# Patient Record
Sex: Female | Born: 1975
Health system: Southern US, Community
[De-identification: ages and names within clinical notes are randomized; demographics above are authoritative.]

## PROBLEM LIST (undated history)

## (undated) DIAGNOSIS — N840 Polyp of corpus uteri: Secondary | ICD-10-CM

## (undated) HISTORY — DX: Polyp of corpus uteri: N84.0

---

## 2017-12-04 ENCOUNTER — Other Ambulatory Visit: Payer: Self-pay

## 2017-12-04 ENCOUNTER — Encounter: Payer: Self-pay | Admitting: Emergency Medicine

## 2017-12-04 ENCOUNTER — Emergency Department
Admission: EM | Admit: 2017-12-04 | Discharge: 2017-12-04 | Disposition: A | Discharge status: Home / Self Care | Payer: No Typology Code available for payment source | Source: Home / Self Care | Attending: Family Medicine | Admitting: Family Medicine

## 2017-12-04 DIAGNOSIS — R21 Rash and other nonspecific skin eruption: Secondary | ICD-10-CM

## 2017-12-04 MED ORDER — PREDNISONE 50 MG PO TABS: 50.0000 mg | ORAL_TABLET | Freq: Every day | ORAL | 0 refills | Status: AC

## 2017-12-04 MED ORDER — CETIRIZINE HCL 10 MG PO TABS: 10.0000 mg | ORAL_TABLET | Freq: Every day | ORAL | 0 refills | Status: AC

## 2017-12-04 MED ORDER — METHYLPREDNISOLONE SODIUM SUCC 40 MG IJ SOLR
80.0000 mg | Freq: Once | INTRAMUSCULAR | Status: AC
  Administered 2017-12-04: 80 mg via INTRAMUSCULAR

## 2017-12-04 NOTE — ED Triage Notes (Signed)
Here with raised red bumps that started last Thursday. Progressed to left lower arm, left knee and back area. Weeping noted to left lower arm. Itching, denies pain. Tried steroid cream today.

## 2017-12-04 NOTE — ED Provider Notes (Signed)
[name]    CSN: [id number] Arrival date & time: 12/04/17  1230     History   Chief Complaint Chief Complaint  Patient presents with  . Rash    HPI Suzanne Henry is a 42 y.o. female.   HPI Suzanne Henry is a 42 y.o. female presenting to UC with c/o 3 days of worsening red itching bumps that continue to spread. Initial bumps started on Left wrist, and have since spread to her Right wrist, Right forearm, Left upper arm, Right knee and one spot on her Left lower back. Some of the bumps are weeping. She applied triamcinolone cream with morning with mild relief.  Denies fever, chills, n/v/d. No oral swelling or SOB. No known sick contacts. No new soaps, lotions or medications.      History reviewed. No pertinent past medical history.  There are no active problems to display for this patient.   History reviewed. No pertinent surgical history.  OB History   None      Home Medications    Prior to Admission medications   Medication Sig Start Date End Date Taking? Authorizing Provider  cetirizine (ZYRTEC) 10 MG tablet Take 1 tablet (10 mg total) by mouth daily. For 1-2 weeks, then daily as needed 12/04/17   Noe Gens, PA-C  predniSONE (DELTASONE) 50 MG tablet Take 1 tablet (50 mg total) by mouth daily with breakfast for 5 days. 12/04/17 12/09/17  Noe Gens, PA-C    Family History History reviewed. No pertinent family history.  Social History Social History   Tobacco Use  . Smoking status: Never Smoker  . Smokeless tobacco: Never Used  Substance Use Topics  . Alcohol use: Not on file  . Drug use: Not on file     Allergies   Patient has no allergy information on record.   Review of Systems Review of Systems  Respiratory: Negative for shortness of breath, wheezing and stridor.   Musculoskeletal: Negative for arthralgias, joint swelling and myalgias.  Skin: Positive for rash.     Physical Exam Triage Vital Signs ED Triage Vitals    Enc Vitals Group     BP 12/04/17 1253 112/67     Pulse Rate 12/04/17 1253 75     Resp --      Temp 12/04/17 1253 98.1 F (36.7 C)     Temp Source 12/04/17 1253 Oral     SpO2 12/04/17 1253 100 %     Weight 12/04/17 1254 115 lb 6.4 oz (52.3 kg)     Height --      Head Circumference --      Peak Flow --      Pain Score 12/04/17 1254 0     Pain Loc --      Pain Edu? --      Excl. in [place]? --    No data found.  Updated Vital Signs BP 112/67 (BP Location: Right Arm)   Pulse 75   Temp 98.1 F (36.7 C) (Oral)   Wt 115 lb 6.4 oz (52.3 kg)   SpO2 100%   Visual Acuity Right Eye Distance:   Left Eye Distance:   Bilateral Distance:    Right Eye Near:   Left Eye Near:    Bilateral Near:     Physical Exam  Constitutional: She is oriented to person, place, and time. She appears well-developed and well-nourished. No distress.  HENT:  Head: Normocephalic and atraumatic.  Eyes: EOM are normal.  Neck: Normal  range of motion.  Cardiovascular: Normal rate.  Pulmonary/Chest: Effort normal.  Musculoskeletal: Normal range of motion.  Neurological: She is alert and oriented to person, place, and time.  Skin: Skin is warm and dry. Rash noted. She is not diaphoretic. There is erythema.  Diffuse erythematous papular rash on bilateral wrists, arms, Right knee and left lower back. Non-tender. Some papules are draining scant clear discharge with dried yellow crusting discharge.  No marks between web spaces on fingers. No tract marks.   Psychiatric: She has a normal mood and affect. Her behavior is normal.  Nursing note and vitals reviewed.    UC Treatments / Results  Labs (all labs ordered are listed, but only abnormal results are displayed) Labs Reviewed - No data to display  EKG None  Radiology No results found.  Procedures Procedures (including critical care time)  Medications Ordered in UC Medications  methylPREDNISolone sodium succinate (SOLU-MEDROL) 40 mg/mL injection 80  mg (80 mg Intramuscular Given 12/04/17 1301)    Initial Impression / Assessment and Plan / UC Course  I have reviewed the triage vital signs and the nursing notes.  Pertinent labs & imaging results that were available during my care of the patient were reviewed by me and considered in my medical decision making (see chart for details).     Nonspecific pruritic rash. No evidence of underlying infection at this time. Will tx symptomatically.  Final Clinical Impressions(s) / UC Diagnoses   Final diagnoses:  Rash and nonspecific skin eruption     Discharge Instructions      Keep rash clean with luke warm water and mild soap. Avoid hot water as this may cause worsening of itching and drying of the skin.  You may continue to use the triamcinolone cream.  You were given a shot of solumedrol (a steroid) today to help with inflammation and itching of the rash.  You have been prescribed 5 days of prednisone, an oral steroid.  You may start this medication tomorrow with breakfast.    Please follow up with family medicine in 1 week if not improving, sooner if significantly worsening.     ED Prescriptions    Medication Sig Dispense Auth. Provider   predniSONE (DELTASONE) 50 MG tablet Take 1 tablet (50 mg total) by mouth daily with breakfast for 5 days. 5 tablet Leeroy Cha O, PA-C   cetirizine (ZYRTEC) 10 MG tablet Take 1 tablet (10 mg total) by mouth daily. For 1-2 weeks, then daily as needed 30 tablet Noe Gens, [place]     Controlled Substance Prescriptions [place] Controlled Substance Registry consulted? Not Applicable   Tyrell Antonio 12/04/17 1323

## 2017-12-04 NOTE — Discharge Instructions (Signed)
°  Keep rash clean with luke warm water and mild soap. Avoid hot water as this may cause worsening of itching and drying of the skin.  You may continue to use the triamcinolone cream.  You were given a shot of solumedrol (a steroid) today to help with inflammation and itching of the rash.  You have been prescribed 5 days of prednisone, an oral steroid.  You may start this medication tomorrow with breakfast.    Please follow up with family medicine in 1 week if not improving, sooner if significantly worsening.

## 2018-01-24 ENCOUNTER — Encounter: Payer: Self-pay | Admitting: Emergency Medicine

## 2018-01-24 ENCOUNTER — Emergency Department
Admission: EM | Admit: 2018-01-24 | Discharge: 2018-01-24 | Disposition: A | Discharge status: Home / Self Care | Payer: No Typology Code available for payment source | Source: Home / Self Care | Attending: Family Medicine | Admitting: Family Medicine

## 2018-01-24 ENCOUNTER — Other Ambulatory Visit: Payer: Self-pay

## 2018-01-24 DIAGNOSIS — W57XXXA Bitten or stung by nonvenomous insect and other nonvenomous arthropods, initial encounter: Secondary | ICD-10-CM

## 2018-01-24 DIAGNOSIS — S1096XA Insect bite of unspecified part of neck, initial encounter: Secondary | ICD-10-CM | POA: Diagnosis not present

## 2018-01-24 MED ORDER — PREDNISONE 50 MG PO TABS: 50.0000 mg | ORAL_TABLET | Freq: Every day | ORAL | 0 refills | Status: AC

## 2018-01-24 NOTE — ED Provider Notes (Signed)
[name]    CSN: [id number] Arrival date & time: 01/24/18  1647     History   Chief Complaint Chief Complaint  Patient presents with  . Rash    HPI Suzanne Henry is a 42 y.o. female.   HPI Suzanne Henry is a 42 y.o. female presenting to UC with c/o gradually worsening redness, itching and mild swelling to the Right side of her neck that started about 2 hours ago after being bit or stung by an insect.  She has not tried anything for her symptoms. Denies throat swelling, trouble breathing or SOB. Denies other rashes today. She did have a rash a few weeks ago and did well with prednisone. She was wondering if she needs more prednisone for this bite/sting.    History reviewed. No pertinent past medical history.  There are no active problems to display for this patient.   History reviewed. No pertinent surgical history.  OB History   None      Home Medications    Prior to Admission medications   Medication Sig Start Date End Date Taking? Authorizing Provider  cetirizine (ZYRTEC) 10 MG tablet Take 1 tablet (10 mg total) by mouth daily. For 1-2 weeks, then daily as needed 12/04/17   Noe Gens, PA-C  predniSONE (DELTASONE) 50 MG tablet Take 1 tablet (50 mg total) by mouth daily with breakfast for 5 days. 01/24/18 01/29/18  Noe Gens, PA-C    Family History History reviewed. No pertinent family history.  Social History Social History   Tobacco Use  . Smoking status: Never Smoker  . Smokeless tobacco: Never Used  Substance Use Topics  . Alcohol use: Not on file  . Drug use: Not on file     Allergies   Patient has no allergy information on record.   Review of Systems Review of Systems  HENT: Negative for trouble swallowing and voice change.   Respiratory: Negative for chest tightness, shortness of breath and wheezing.   Gastrointestinal: Negative for nausea and vomiting.  Skin: Positive for rash. Negative for wound.     Physical  Exam Triage Vital Signs ED Triage Vitals  Enc Vitals Group     BP 01/24/18 1711 113/69     Pulse Rate 01/24/18 1711 71     Resp --      Temp 01/24/18 1711 98.3 F (36.8 C)     Temp Source 01/24/18 1711 Oral     SpO2 01/24/18 1711 100 %     Weight 01/24/18 1712 118 lb (53.5 kg)     Height 01/24/18 1712 5\' 2"  (1.575 m)     Head Circumference --      Peak Flow --      Pain Score 01/24/18 1712 8     Pain Loc --      Pain Edu? --      Excl. in [place]? --    No data found.  Updated Vital Signs BP 113/69 (BP Location: Right Arm)   Pulse 71   Temp 98.3 F (36.8 C) (Oral)   Ht 5\' 2"  (1.575 m)   Wt 118 lb (53.5 kg)   SpO2 100%   BMI 21.58 kg/m   Visual Acuity Right Eye Distance:   Left Eye Distance:   Bilateral Distance:    Right Eye Near:   Left Eye Near:    Bilateral Near:     Physical Exam  Constitutional: She is oriented to person, place, and time. She appears well-developed  and well-nourished.  HENT:  Head: Normocephalic and atraumatic.    Mouth/Throat: Oropharynx is clear and moist.  3cm area of erythema with centralized 20mm papule. Minimally tender. No bleeding or drainage. No induration or fluctuance.   Eyes: EOM are normal.  Neck: Normal range of motion.  Cardiovascular: Normal rate.  Pulmonary/Chest: Effort normal. No stridor. No respiratory distress. She has no wheezes.  Musculoskeletal: Normal range of motion.  Neurological: She is alert and oriented to person, place, and time.  Skin: Skin is warm and dry. There is erythema.  Psychiatric: She has a normal mood and affect. Her behavior is normal.  Nursing note and vitals reviewed.    UC Treatments / Results  Labs (all labs ordered are listed, but only abnormal results are displayed) Labs Reviewed - No data to display  EKG None  Radiology No results found.  Procedures Procedures (including critical care time)  Medications Ordered in UC Medications - No data to display  Initial Impression /  Assessment and Plan / UC Course  I have reviewed the triage vital signs and the nursing notes.  Pertinent labs & imaging results that were available during my care of the patient were reviewed by me and considered in my medical decision making (see chart for details).    Hx and exam c/w insect bite/sting Depo-medrol given in UC New prescription for prednisone and home care instructions provided.  Final Clinical Impressions(s) / UC Diagnoses   Final diagnoses:  Insect bite of neck, initial encounter     Discharge Instructions      You were given a shot of depo-medrol (a steroid) today to help with itching and swelling from a likely allergic reaction.  You have been prescribed 5 days of prednisone, an oral steroid.  You may start this medication tomorrow with breakfast.   You may also use a cool compress and the triamcinolone cream and cetirizine (Zyrtec) to help with inflammation and itching.      ED Prescriptions    Medication Sig Dispense Auth. Provider   predniSONE (DELTASONE) 50 MG tablet Take 1 tablet (50 mg total) by mouth daily with breakfast for 5 days. 5 tablet Noe Gens, PA-C     Controlled Substance Prescriptions [place] Controlled Substance Registry consulted? Not Applicable   Tyrell Antonio 01/24/18 1758

## 2018-01-24 NOTE — Discharge Instructions (Addendum)
°  You were given a shot of depo-medrol (a steroid) today to help with itching and swelling from a likely allergic reaction.  You have been prescribed 5 days of prednisone, an oral steroid.  You may start this medication tomorrow with breakfast.   You may also use a cool compress and the triamcinolone cream and cetirizine (Zyrtec) to help with inflammation and itching.

## 2018-01-24 NOTE — ED Triage Notes (Signed)
Right side of neck, possible bug bite, red, swollen itchy, burning, spreading

## 2018-12-02 ENCOUNTER — Other Ambulatory Visit: Payer: Self-pay | Admitting: Family Medicine

## 2018-12-02 DIAGNOSIS — Z1231 Encounter for screening mammogram for malignant neoplasm of breast: Secondary | ICD-10-CM

## 2019-01-18 ENCOUNTER — Other Ambulatory Visit: Payer: Self-pay

## 2019-01-18 ENCOUNTER — Ambulatory Visit
Admission: RE | Admit: 2019-01-18 | Discharge: 2019-01-18 | Disposition: A | Discharge status: Home / Self Care | Payer: No Typology Code available for payment source | Source: Ambulatory Visit | Attending: Family Medicine | Admitting: Family Medicine

## 2019-01-18 DIAGNOSIS — Z1231 Encounter for screening mammogram for malignant neoplasm of breast: Secondary | ICD-10-CM

## 2020-03-11 ENCOUNTER — Telehealth: Payer: Self-pay | Admitting: *Deleted

## 2020-03-11 NOTE — Telephone Encounter (Signed)
Wants to schedule New GYN appointment, advised of medical records transfer to get appointment scheduled. Agreed to contact office in [place].

## 2020-03-28 ENCOUNTER — Other Ambulatory Visit (HOSPITAL_COMMUNITY)
Admission: RE | Admit: 2020-03-28 | Discharge: 2020-03-28 | Disposition: A | Discharge status: Home / Self Care | Payer: No Typology Code available for payment source | Source: Ambulatory Visit | Attending: Obstetrics & Gynecology | Admitting: Obstetrics & Gynecology

## 2020-03-28 ENCOUNTER — Encounter: Payer: Self-pay | Admitting: Obstetrics & Gynecology

## 2020-03-28 ENCOUNTER — Other Ambulatory Visit: Payer: Self-pay

## 2020-03-28 ENCOUNTER — Encounter: Payer: Self-pay | Admitting: *Deleted

## 2020-03-28 ENCOUNTER — Ambulatory Visit (INDEPENDENT_AMBULATORY_CARE_PROVIDER_SITE_OTHER): Payer: No Typology Code available for payment source | Admitting: Obstetrics & Gynecology

## 2020-03-28 VITALS — BP 89/54 | HR 67 | Resp 16 | Ht 62.0 in | Wt 122.0 lb

## 2020-03-28 DIAGNOSIS — N852 Hypertrophy of uterus: Secondary | ICD-10-CM

## 2020-03-28 DIAGNOSIS — Z01419 Encounter for gynecological examination (general) (routine) without abnormal findings: Secondary | ICD-10-CM | POA: Diagnosis not present

## 2020-03-28 DIAGNOSIS — D500 Iron deficiency anemia secondary to blood loss (chronic): Secondary | ICD-10-CM

## 2020-03-28 DIAGNOSIS — Z1231 Encounter for screening mammogram for malignant neoplasm of breast: Secondary | ICD-10-CM

## 2020-03-28 DIAGNOSIS — N920 Excessive and frequent menstruation with regular cycle: Secondary | ICD-10-CM | POA: Diagnosis not present

## 2020-03-28 DIAGNOSIS — Z124 Encounter for screening for malignant neoplasm of cervix: Secondary | ICD-10-CM | POA: Diagnosis present

## 2020-03-28 NOTE — Patient Instructions (Addendum)
Please sent me a copy of your iron levels if possible.    Levonorgestrel intrauterine device (IUD) What is this medicine? LEVONORGESTREL IUD (LEE voe nor jes trel) is a contraceptive (birth control) device. The device is placed inside the uterus by a health care provider. It is used to prevent pregnancy. Some devices can also be used to treat heavy bleeding that occurs during your period. This medicine may be used for other purposes; ask your health care provider or pharmacist if you have questions. COMMON BRAND NAME(S): Minette [place] What should I tell my health care provider before I take this medicine? They need to know if you have any of these conditions:  abnormal Pap smear  cancer of the breast, uterus, or cervix  diabetes  endometritis  genital or pelvic infection now or in the past  have more than one sexual partner or your partner has more than one partner  heart disease  history of an ectopic or tubal pregnancy  immune system problems  IUD in place  liver disease or tumor  problems with blood clots or take blood-thinners  seizures  use intravenous drugs  uterus of unusual shape  vaginal bleeding that has not been explained  an unusual or allergic reaction to levonorgestrel, other hormones, silicone, or polyethylene, medicines, foods, dyes, or preservatives  pregnant or trying to get pregnant  breast-feeding How should I use this medicine? This device is placed inside the uterus by a health care professional. Talk to your pediatrician regarding the use of this medicine in children. Special care may be needed. Overdosage: If you think you have taken too much of this medicine contact a poison control center or emergency room at once. NOTE: This medicine is only for you. Do not share this medicine with others. What if I miss a dose? This does not apply. Depending on the brand of device you have inserted, the device will need to be replaced  every 3 to 7 years if you wish to continue using this type of birth control. What may interact with this medicine? Do not take this medicine with any of the following medications:  amprenavir  bosentan  fosamprenavir This medicine may also interact with the following medications:  aprepitant  armodafinil  barbiturate medicines for inducing sleep or treating seizures  bexarotene  boceprevir  griseofulvin  medicines to treat seizures like carbamazepine, ethotoin, felbamate, oxcarbazepine, phenytoin, topiramate  modafinil  pioglitazone  rifabutin  rifampin  rifapentine  some medicines to treat HIV infection like atazanavir, efavirenz, indinavir, lopinavir, nelfinavir, tipranavir, ritonavir  St. John's wort  warfarin This list may not describe all possible interactions. Give your health care provider a list of all the medicines, herbs, non-prescription drugs, or dietary supplements you use. Also tell them if you smoke, drink alcohol, or use illegal drugs. Some items may interact with your medicine. What should I watch for while using this medicine? Visit your doctor or health care professional for regular check ups. See your doctor if you or your partner has sexual contact with others, becomes HIV positive, or gets a sexual transmitted disease. This product does not protect you against HIV infection (AIDS) or other sexually transmitted diseases. You can check the placement of the IUD yourself by reaching up to the top of your vagina with clean fingers to feel the threads. Do not pull on the threads. It is a good habit to check placement after each menstrual period. Call your doctor right away if you feel more  of the IUD than just the threads or if you cannot feel the threads at all. The IUD may come out by itself. You may become pregnant if the device comes out. If you notice that the IUD has come out use a backup birth control method like condoms and call your health care  provider. Using tampons will not change the position of the IUD and are okay to use during your period. This IUD can be safely scanned with magnetic resonance imaging (MRI) only under specific conditions. Before you have an MRI, tell your healthcare provider that you have an IUD in place, and which type of IUD you have in place. What side effects may I notice from receiving this medicine? Side effects that you should report to your doctor or health care professional as soon as possible:  allergic reactions like skin rash, itching or hives, swelling of the face, lips, or tongue  fever, flu-like symptoms  genital sores  high blood pressure  no menstrual period for 6 weeks during use  pain, swelling, warmth in the leg  pelvic pain or tenderness  severe or sudden headache  signs of pregnancy  stomach cramping  sudden shortness of breath  trouble with balance, talking, or walking  unusual vaginal bleeding, discharge  yellowing of the eyes or skin Side effects that usually do not require medical attention (report to your doctor or health care professional if they continue or are bothersome):  acne  breast pain  change in sex drive or performance  changes in weight  cramping, dizziness, or faintness while the device is being inserted  headache  irregular menstrual bleeding within first 3 to 6 months of use  nausea This list may not describe all possible side effects. Call your doctor for medical advice about side effects. You may report side effects to FDA at 1-800-FDA-1088. Where should I keep my medicine? This does not apply. NOTE: This sheet is a summary. It may not cover all possible information. If you have questions about this medicine, talk to your doctor, pharmacist, or health care provider.  2021 Elsevier/Gold Standard (2019-10-31 16:27:45)

## 2020-03-28 NOTE — Progress Notes (Signed)
45 y.o. G4P2 Married White or Caucasian female here for annual exam/new patient exam.  Reports cycles are regular.  Flow lasts 5-6 days.  First two days are very heavy.  She changes products every 1 1/2 to 2 hours.  Does pass clots and cramps during the heavier days too.  This is different the last six months.  Reports hemoglobin was 8-9 and iron was low.    She moved from [facility] in early 2020.    PCP:  Dr. Orpah Melter.  Has appt for preventative exam in March.  Patient's last menstrual period was 03/14/2020.          Sexually active: Yes.    The current method of family planning is none.    Exercising: Yes.    Smoker:  no   reports that she has never smoked. She has never used smokeless tobacco. She reports previous alcohol use. She reports that she does not use drugs.  History reviewed. No pertinent past medical history.  History reviewed. No pertinent surgical history.  Current Outpatient Medications  Medication Sig Dispense Refill  . cetirizine (ZYRTEC) 10 MG tablet Take 1 tablet (10 mg total) by mouth daily. For 1-2 weeks, then daily as needed (Patient not taking: Reported on 03/28/2020) 30 tablet 0   No current facility-administered medications for this visit.    Family History  Problem Relation Age of Onset  . Blindness Maternal Grandmother   . Stomach cancer Maternal Grandfather   . Heart attack Father     Review of Systems  Constitutional: Negative.   Respiratory: Negative.   Cardiovascular: Negative.   Genitourinary: Positive for menstrual problem.  Psychiatric/Behavioral: Negative.   All other systems reviewed and are negative.   Exam:   BP (!) 89/54   Pulse 67   Resp 16   Ht 5\' 2"  (1.575 m)   Wt 122 lb (55.3 kg)   LMP 03/14/2020   BMI 22.31 kg/m   Height: 5\' 2"  (157.5 cm)  General appearance: alert, cooperative and appears stated age Head: Normocephalic, without obvious abnormality, atraumatic Neck: no adenopathy, supple, symmetrical, trachea midline  and thyroid normal to inspection and palpation Lungs: clear to auscultation bilaterally Breasts: normal appearance, no masses or tenderness Heart: regular rate and rhythm Abdomen: soft, non-tender; bowel sounds normal; no masses,  no organomegaly Extremities: extremities normal, atraumatic, no cyanosis or edema Skin: Skin color, texture, turgor normal. No rashes or lesions Lymph nodes: Cervical, supraclavicular, and axillary nodes normal. No abnormal inguinal nodes palpated Neurologic: Grossly normal   Pelvic: External genitalia:  no lesions              Urethra:  normal appearing urethra with no masses, tenderness or lesions              Bartholins and Skenes: normal                 Vagina: normal appearing vagina with normal color and discharge, no lesions              Cervix: no lesions              Pap taken: Yes.   Bimanual Exam:  Uterus:  enlarged, 10 weeks size, mobile but uterus is firm              Adnexa: normal adnexa and no mass, fullness, tenderness               Rectovaginal: Confirms  Anus:  normal sphincter tone, no lesions  Chaperone, Sherryle Lis, CMA, was present for exam.  Assessment/Plan: 1. Well woman exam with routine gynecological exam - Pap with HR HPV obtained today - MMG ordered and will help with scheduling - colonoscopy after age 28  2. Enlarged uterus - Feel small fibroids or adenomyosis present.  Treatment options discussed.  Has failed Nuva ring.  Progesterone, Mirena IUD, possible endometrial ablation, hysterectomy are options at this point.   - US PELVIC COMPLETE WITH TRANSVAGINAL; Future  3. Menorrhagia with regular cycle  4. Iron deficiency anemia due to chronic blood loss - CBC with Differential/Platelet - Iron - Ferritin

## 2020-03-29 DIAGNOSIS — D509 Iron deficiency anemia, unspecified: Secondary | ICD-10-CM | POA: Insufficient documentation

## 2020-03-29 DIAGNOSIS — N920 Excessive and frequent menstruation with regular cycle: Secondary | ICD-10-CM | POA: Insufficient documentation

## 2020-03-29 DIAGNOSIS — N852 Hypertrophy of uterus: Secondary | ICD-10-CM | POA: Insufficient documentation

## 2020-03-29 LAB — CBC WITH DIFFERENTIAL/PLATELET
Absolute Monocytes: 485 cells/uL (ref 200–950)
Basophils Absolute: 59 cells/uL (ref 0–200)
Basophils Relative: 1.2 %
Eosinophils Absolute: 78 cells/uL (ref 15–500)
Eosinophils Relative: 1.6 %
HCT: 31 % — ABNORMAL LOW (ref 35.0–45.0)
Hemoglobin: 10.1 g/dL — ABNORMAL LOW (ref 11.7–15.5)
Lymphs Abs: 1980 cells/uL (ref 850–3900)
MCH: 28.4 pg (ref 27.0–33.0)
MCHC: 32.6 g/dL (ref 32.0–36.0)
MCV: 87.1 fL (ref 80.0–100.0)
MPV: 9.6 fL (ref 7.5–12.5)
Monocytes Relative: 9.9 %
Neutro Abs: 2298 cells/uL (ref 1500–7800)
Neutrophils Relative %: 46.9 %
Platelets: 318 10*3/uL (ref 140–400)
RBC: 3.56 10*6/uL — ABNORMAL LOW (ref 3.80–5.10)
RDW: 12.1 % (ref 11.0–15.0)
Total Lymphocyte: 40.4 %
WBC: 4.9 10*3/uL (ref 3.8–10.8)

## 2020-03-29 LAB — CYTOLOGY - PAP
Comment: NEGATIVE
Diagnosis: NEGATIVE
High risk HPV: NEGATIVE

## 2020-03-29 LAB — FERRITIN: Ferritin: 3 ng/mL — ABNORMAL LOW (ref 16–232)

## 2020-03-29 LAB — IRON: Iron: 28 ug/dL — ABNORMAL LOW (ref 40–190)

## 2020-04-04 ENCOUNTER — Other Ambulatory Visit: Payer: Self-pay

## 2020-04-04 ENCOUNTER — Ambulatory Visit (INDEPENDENT_AMBULATORY_CARE_PROVIDER_SITE_OTHER): Payer: No Typology Code available for payment source

## 2020-04-04 DIAGNOSIS — D250 Submucous leiomyoma of uterus: Secondary | ICD-10-CM

## 2020-04-04 DIAGNOSIS — N920 Excessive and frequent menstruation with regular cycle: Secondary | ICD-10-CM

## 2020-04-04 DIAGNOSIS — N852 Hypertrophy of uterus: Secondary | ICD-10-CM

## 2020-04-04 DIAGNOSIS — D500 Iron deficiency anemia secondary to blood loss (chronic): Secondary | ICD-10-CM

## 2020-05-09 ENCOUNTER — Ambulatory Visit: Payer: No Typology Code available for payment source

## 2020-05-16 ENCOUNTER — Encounter (HOSPITAL_BASED_OUTPATIENT_CLINIC_OR_DEPARTMENT_OTHER): Payer: Self-pay

## 2020-05-22 ENCOUNTER — Other Ambulatory Visit: Payer: Self-pay

## 2020-05-22 ENCOUNTER — Ambulatory Visit (INDEPENDENT_AMBULATORY_CARE_PROVIDER_SITE_OTHER): Payer: No Typology Code available for payment source

## 2020-05-22 DIAGNOSIS — Z1231 Encounter for screening mammogram for malignant neoplasm of breast: Secondary | ICD-10-CM | POA: Diagnosis not present

## 2020-10-17 ENCOUNTER — Other Ambulatory Visit (HOSPITAL_COMMUNITY)
Admission: RE | Admit: 2020-10-17 | Discharge: 2020-10-17 | Disposition: A | Discharge status: Home / Self Care | Payer: No Typology Code available for payment source | Source: Ambulatory Visit | Attending: Obstetrics and Gynecology | Admitting: Obstetrics and Gynecology

## 2020-10-17 ENCOUNTER — Encounter: Payer: Self-pay | Admitting: Obstetrics and Gynecology

## 2020-10-17 ENCOUNTER — Other Ambulatory Visit: Payer: Self-pay

## 2020-10-17 ENCOUNTER — Ambulatory Visit (INDEPENDENT_AMBULATORY_CARE_PROVIDER_SITE_OTHER): Payer: No Typology Code available for payment source | Admitting: Obstetrics and Gynecology

## 2020-10-17 VITALS — BP 104/66 | HR 87 | Ht 62.0 in | Wt 119.0 lb

## 2020-10-17 DIAGNOSIS — N941 Unspecified dyspareunia: Secondary | ICD-10-CM | POA: Insufficient documentation

## 2020-10-17 MED ORDER — MEGESTROL ACETATE 40 MG PO TABS: 40.0000 mg | ORAL_TABLET | Freq: Every day | ORAL | 5 refills | Status: AC

## 2020-10-17 NOTE — Progress Notes (Signed)
45 yo P3 presenting today to follow up on heavy cycle and uterine polyp. Patient reports that since her visit in January the flow of her menses seemed to have improved, however she started to experience vaginal bleeding and pelvic pain with intercourse. Patient states that no clear plan was offered following her ultrasound and sne is interested in treatment options  Past Medical History:  Diagnosis Date   Uterine polyp    History reviewed. No pertinent surgical history. Family History  Problem Relation Age of Onset   Blindness Maternal Grandmother    Stomach cancer Maternal Grandfather    Heart attack Father    Social History   Tobacco Use   Smoking status: Never   Smokeless tobacco: Never  Vaping Use   Vaping Use: Never used  Substance Use Topics   Alcohol use: Not Currently   Drug use: Never   ROS See pertinent in HPI. All other systems reviewed and non contributory Blood pressure 104/66, pulse 87, height '5\' 2"'$  (1.575 m), weight 119 lb (54 kg), last menstrual period 09/30/2020. GENERAL: Well-developed, well-nourished female in no acute distress.  NEURO: alert and oriented x 3  03/2020 ultrasound FINDINGS: Uterus   Measurements: 8.3 x 5.2 x 6.5 cm = volume: 146 mL. Retroverted. Heterogeneous myometrium. Small submucosal leiomyoma at posterior upper uterus 17 x 11 x 14 mm. No additional masses.   Endometrium   Thickness: 7 mm.  Trace endometrial fluid.  No definite focal mass   Right ovary   Measurements: 4.6 x 2.1 x 2.8 cm = volume: 14 mL. Normal morphology without mass   Left ovary   Measurements: 4.3 x 2.1 x 1.7 cm = volume: 8 mL. Normal morphology without mass   Other findings   No free pelvic fluid.  No adnexal masses.   IMPRESSION: Probable small submucosal leiomyoma at posterior upper uterus 17 mm diameter.   Remainder of exam unremarkable.     Electronically Signed   By: Lavonia Dana M.D.   On: 04/04/2020 16:08  A/P 45 yo with dyspareunia and  AUB - Discussed medical management of AUB with medication such as megace and other progesterone options vs operative hysteroscopy - Expressed concerns that fibroid is unlikely responsible for her dyspareunia given its small size and location - Vaginal swab collected to rule out BV - Patient opted for medical management with megace for now with plans to return in 3 months if no improvement - Patient will be contacted with abnormal results

## 2020-10-18 LAB — CERVICOVAGINAL ANCILLARY ONLY
Bacterial Vaginitis (gardnerella): NEGATIVE
Candida Glabrata: NEGATIVE
Candida Vaginitis: NEGATIVE
Comment: NEGATIVE
Comment: NEGATIVE
Comment: NEGATIVE

## 2021-09-27 IMAGING — MG MM DIGITAL SCREENING BILAT W/ TOMO AND CAD
8 series · 9 of 24 positions shown · non-contrast
Comparison: Previous exam(s).

CLINICAL DATA: Screening.

EXAM:
DIGITAL SCREENING BILATERAL MAMMOGRAM WITH TOMOSYNTHESIS AND CAD
TECHNIQUE: Bilateral screening digital craniocaudal and mediolateral oblique
mammograms were obtained. Bilateral screening digital breast
tomosynthesis was performed. The images were evaluated with
computer-aided detection.

[L CC synth-2D]
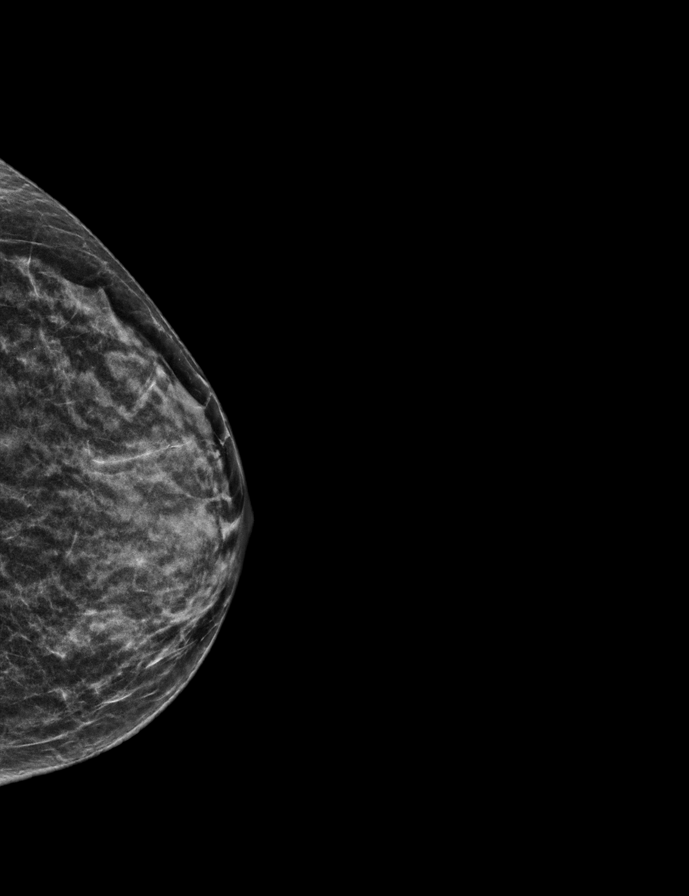

[L MLO synth-2D]
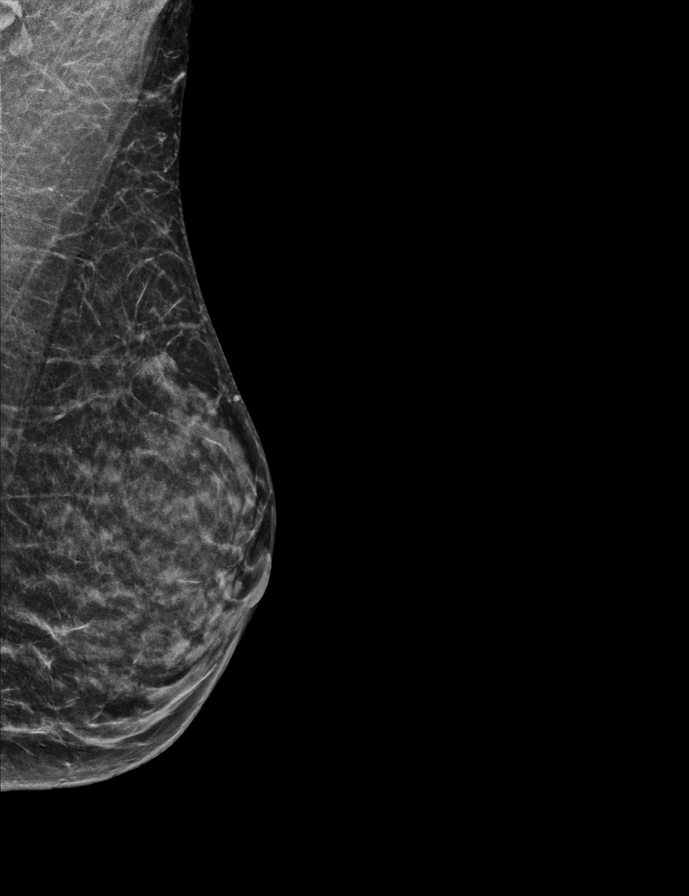

[R MLO synth-2D]
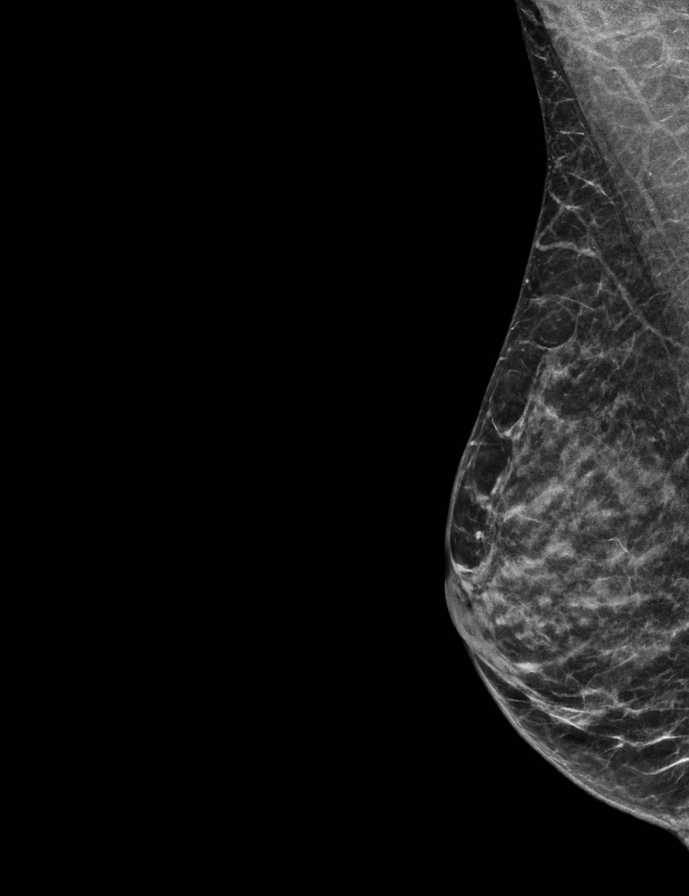

[R CC synth-2D]
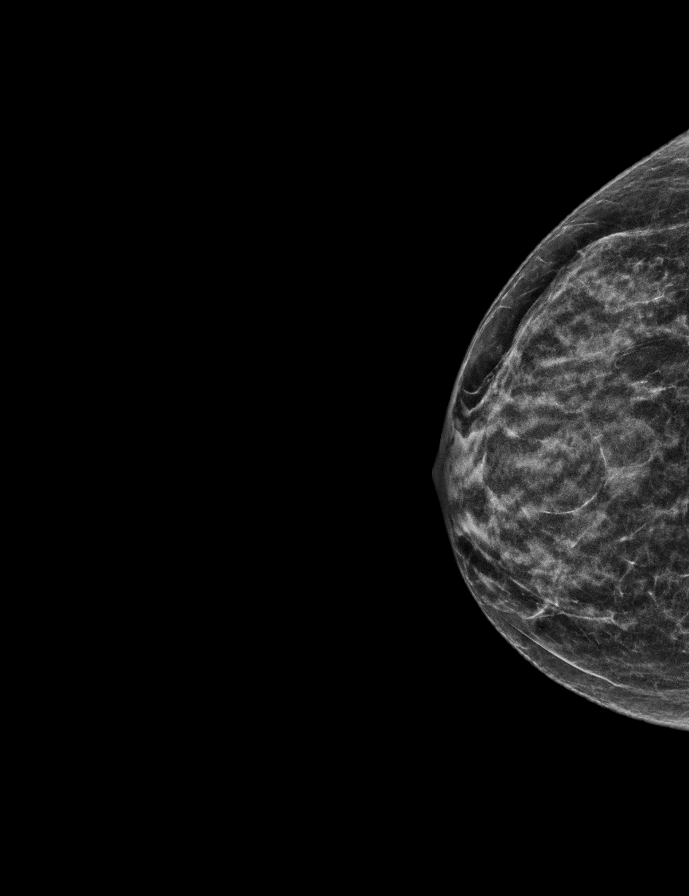

[L MLO tomo · 2 of 43 frames shown]
[frame 14/43]
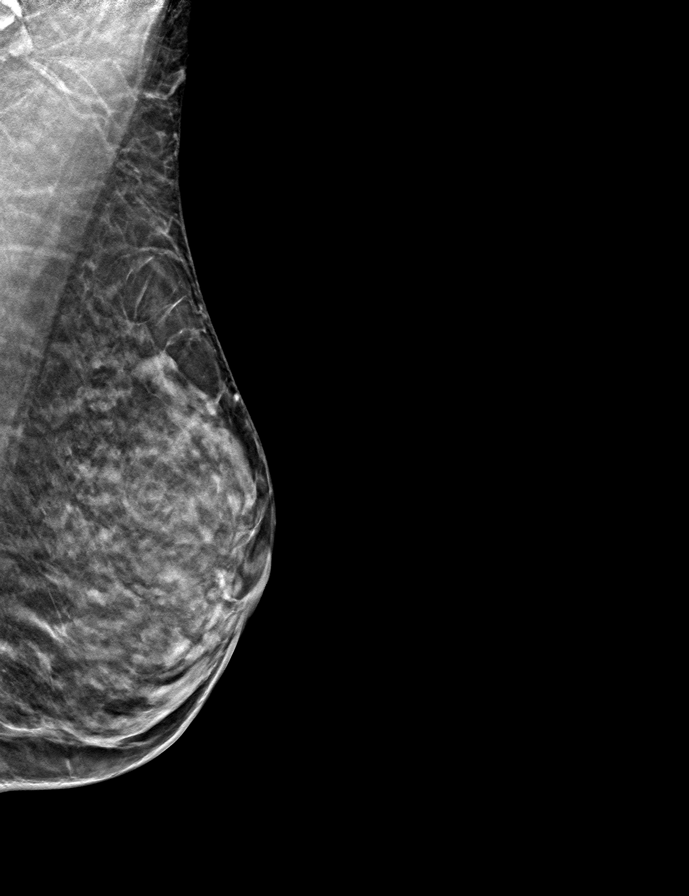
[frame 22/43]
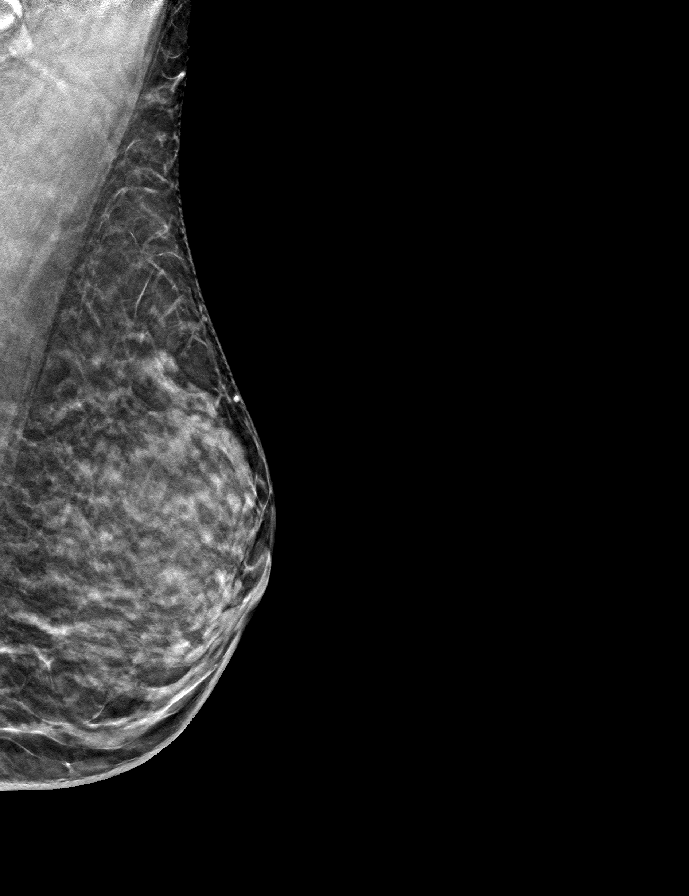

[R CC tomo · tomo slice 21/41.0]
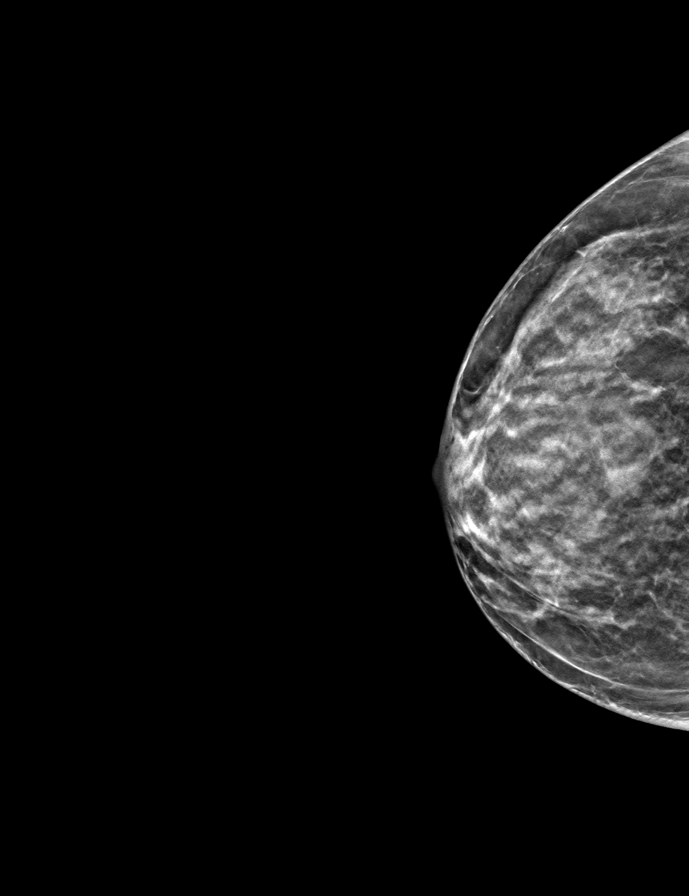

[R MLO tomo · tomo slice 21/41.0]
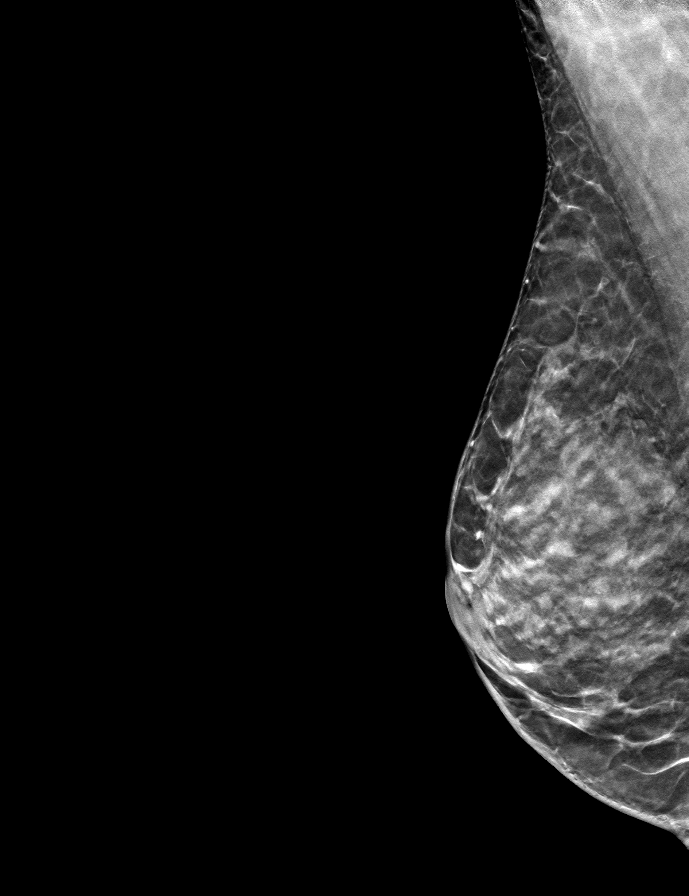

[L CC tomo · tomo slice 21/41.0]
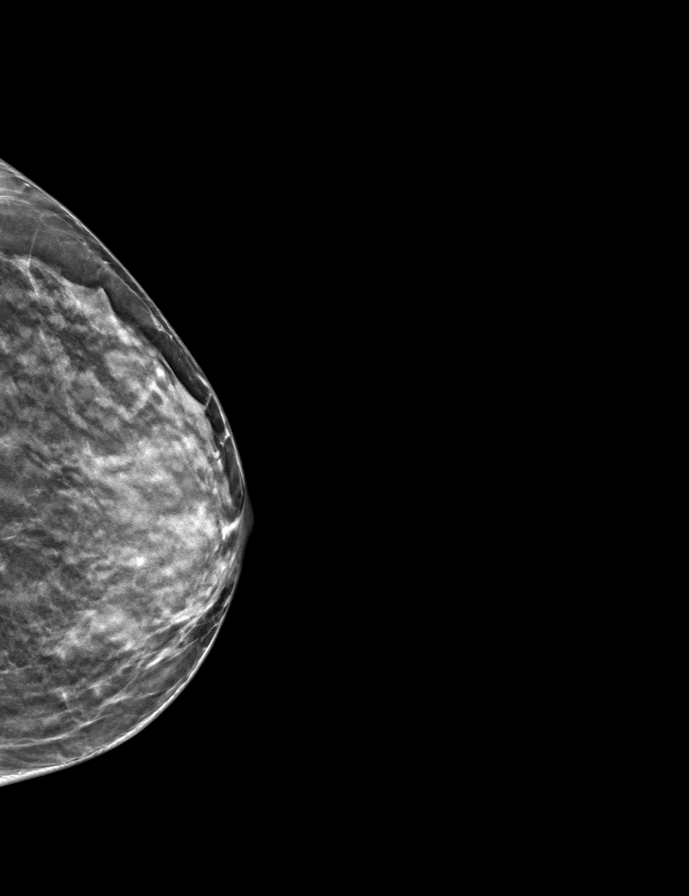

[9 of 24 positions shown; findings below may reference images not displayed]

ACR Breast Density Category d: The breast tissue is extremely dense,
which lowers the sensitivity of mammography
FINDINGS: There are no findings suspicious for malignancy. The images were
evaluated with computer-aided detection.
IMPRESSION: No mammographic evidence of malignancy. A result letter of this
screening mammogram will be mailed directly to the patient.

RECOMMENDATION:
Screening mammogram in one year. (Code:95-0-E9V)

BI-RADS CATEGORY  1: Negative.

## 2022-01-12 ENCOUNTER — Other Ambulatory Visit (HOSPITAL_BASED_OUTPATIENT_CLINIC_OR_DEPARTMENT_OTHER): Payer: Self-pay | Admitting: Physician Assistant

## 2022-01-12 DIAGNOSIS — Z1231 Encounter for screening mammogram for malignant neoplasm of breast: Secondary | ICD-10-CM

## 2022-01-15 ENCOUNTER — Ambulatory Visit (INDEPENDENT_AMBULATORY_CARE_PROVIDER_SITE_OTHER): Payer: No Typology Code available for payment source

## 2022-01-15 DIAGNOSIS — Z1231 Encounter for screening mammogram for malignant neoplasm of breast: Secondary | ICD-10-CM | POA: Diagnosis not present

## 2022-01-19 ENCOUNTER — Telehealth: Payer: Self-pay

## 2022-01-19 NOTE — Telephone Encounter (Signed)
Called [facility] informing patient that recent mammogram completed on 11.02.23, was Normal, she can contact [facility] if she has any questions or concerns by calling [phone].

## 2023-02-15 ENCOUNTER — Other Ambulatory Visit: Payer: Self-pay | Admitting: Family Medicine

## 2023-02-15 DIAGNOSIS — Z1231 Encounter for screening mammogram for malignant neoplasm of breast: Secondary | ICD-10-CM

## 2023-03-03 ENCOUNTER — Ambulatory Visit (INDEPENDENT_AMBULATORY_CARE_PROVIDER_SITE_OTHER): Payer: PRIVATE HEALTH INSURANCE

## 2023-03-03 DIAGNOSIS — Z1231 Encounter for screening mammogram for malignant neoplasm of breast: Secondary | ICD-10-CM

## 2023-12-02 ENCOUNTER — Telehealth: Payer: Self-pay | Admitting: *Deleted

## 2023-12-02 NOTE — Telephone Encounter (Signed)
Returned call from 2:40 PM. Left patient a message to call and schedule. ?

## 2024-02-14 ENCOUNTER — Other Ambulatory Visit (HOSPITAL_BASED_OUTPATIENT_CLINIC_OR_DEPARTMENT_OTHER): Payer: Self-pay | Admitting: Family Medicine

## 2024-02-14 DIAGNOSIS — Z1231 Encounter for screening mammogram for malignant neoplasm of breast: Secondary | ICD-10-CM

## 2024-03-06 ENCOUNTER — Ambulatory Visit (HOSPITAL_BASED_OUTPATIENT_CLINIC_OR_DEPARTMENT_OTHER)
Admission: RE | Admit: 2024-03-06 | Discharge: 2024-03-06 | Disposition: A | Discharge status: Home / Self Care | Payer: PRIVATE HEALTH INSURANCE | Source: Ambulatory Visit | Attending: Family Medicine | Admitting: Family Medicine

## 2024-03-06 ENCOUNTER — Encounter (HOSPITAL_BASED_OUTPATIENT_CLINIC_OR_DEPARTMENT_OTHER): Payer: Self-pay

## 2024-03-06 DIAGNOSIS — Z1231 Encounter for screening mammogram for malignant neoplasm of breast: Secondary | ICD-10-CM | POA: Insufficient documentation

## 2024-03-13 ENCOUNTER — Other Ambulatory Visit: Payer: Self-pay | Admitting: Family Medicine

## 2024-03-13 DIAGNOSIS — R928 Other abnormal and inconclusive findings on diagnostic imaging of breast: Secondary | ICD-10-CM

## 2024-03-22 ENCOUNTER — Other Ambulatory Visit: Payer: Self-pay | Admitting: Physician Assistant

## 2024-03-22 DIAGNOSIS — R928 Other abnormal and inconclusive findings on diagnostic imaging of breast: Secondary | ICD-10-CM

## 2024-03-23 ENCOUNTER — Ambulatory Visit: Payer: PRIVATE HEALTH INSURANCE

## 2024-03-23 ENCOUNTER — Ambulatory Visit
Admission: RE | Admit: 2024-03-23 | Discharge: 2024-03-23 | Disposition: A | Discharge status: Home / Self Care | Payer: PRIVATE HEALTH INSURANCE | Source: Ambulatory Visit | Attending: Family Medicine | Admitting: Family Medicine

## 2024-03-23 DIAGNOSIS — R928 Other abnormal and inconclusive findings on diagnostic imaging of breast: Secondary | ICD-10-CM

## 2024-05-04 ENCOUNTER — Ambulatory Visit: Payer: PRIVATE HEALTH INSURANCE | Admitting: Neurology
